# Patient Record
Sex: Female | Born: 2000 | Hispanic: Yes | Marital: Single | Smoking: Former smoker
Health system: Southern US, Community
[De-identification: ages and names within clinical notes are randomized; demographics above are authoritative.]

## PROBLEM LIST (undated history)

## (undated) DIAGNOSIS — F913 Oppositional defiant disorder: Secondary | ICD-10-CM

## (undated) DIAGNOSIS — F410 Panic disorder [episodic paroxysmal anxiety] without agoraphobia: Secondary | ICD-10-CM

## (undated) DIAGNOSIS — R4689 Other symptoms and signs involving appearance and behavior: Secondary | ICD-10-CM

## (undated) DIAGNOSIS — F4010 Social phobia, unspecified: Secondary | ICD-10-CM

## (undated) DIAGNOSIS — F419 Anxiety disorder, unspecified: Secondary | ICD-10-CM

## (undated) HISTORY — PX: MOUTH SURGERY: SHX715

---
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
## 2019-04-19 ENCOUNTER — Ambulatory Visit: Payer: Medicaid Other

## 2020-03-18 IMAGING — CT CT ABD-PELV W/ CM
2 of 4 series · 16 of 46 positions shown, 18 images · IV contrast (ISOVUE)
Comparison: None.

CLINICAL DATA: Abdominal pain.  Evaluate for appendicitis.

EXAM:
CT ABDOMEN AND PELVIS WITH CONTRAST
TECHNIQUE: Multidetector CT imaging of the abdomen and pelvis was performed
using the standard protocol following bolus administration of
intravenous contrast.
CONTRAST:  100mL MJWOE0-LII IOPAMIDOL (MJWOE0-LII) INJECTION 61%

[Series 2: axial st · axial · 0.68mm/px · z∈[-545,-165]mm · 13 of 86 slices shown, 15 images]
[im 5/86  soft-tissue]
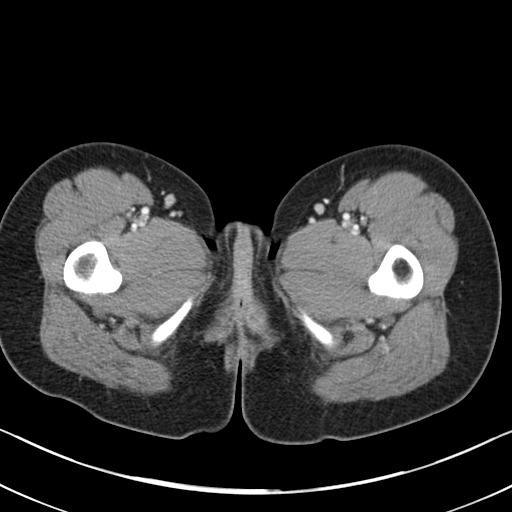
[im 5/86  bone]
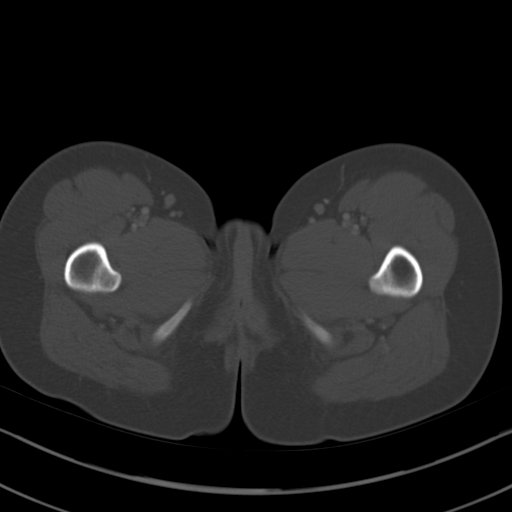
[im 13/86  soft-tissue]
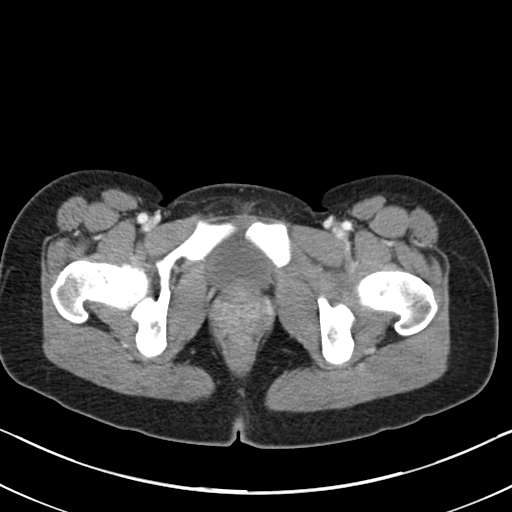
[im 18/86  soft-tissue]
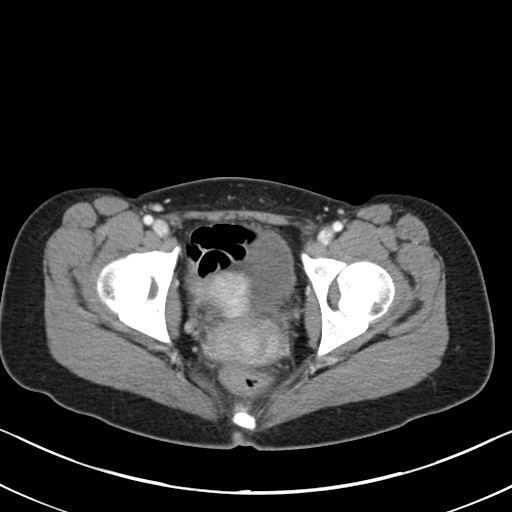
[im 26/86  soft-tissue]
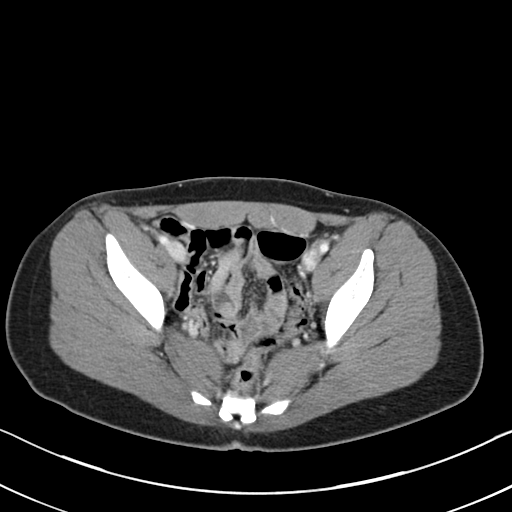
[im 30/86  soft-tissue]
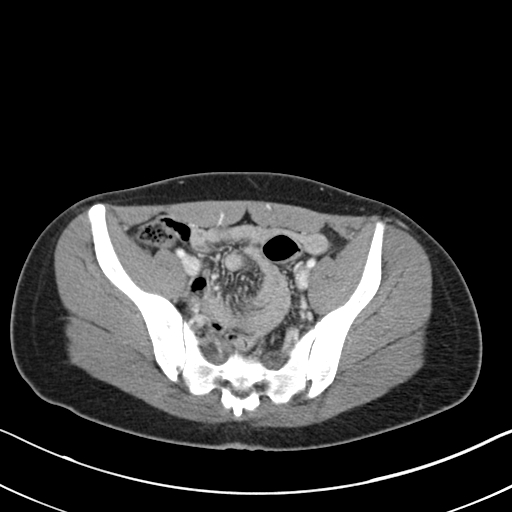
[im 39/86  soft-tissue]
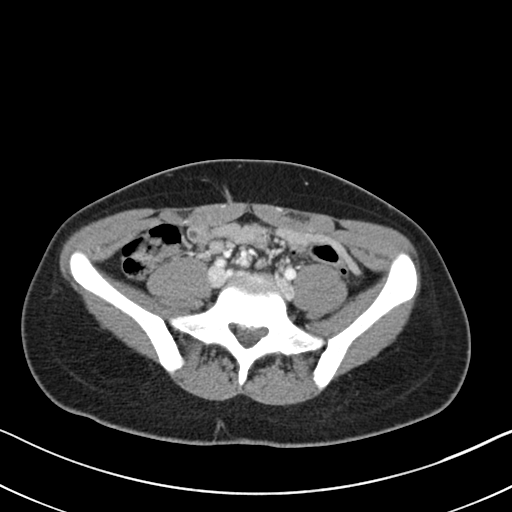
[im 43/86  soft-tissue]
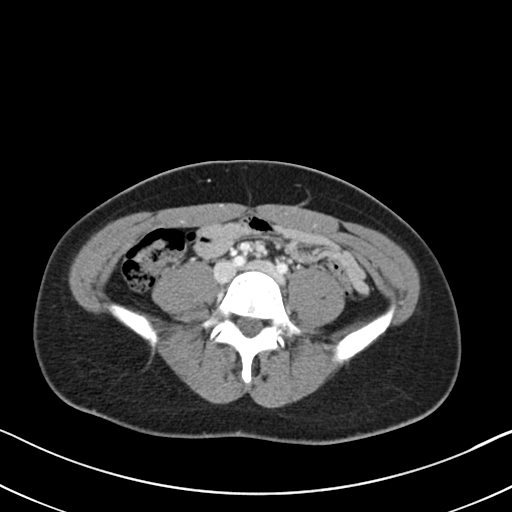
[im 47/86  soft-tissue]
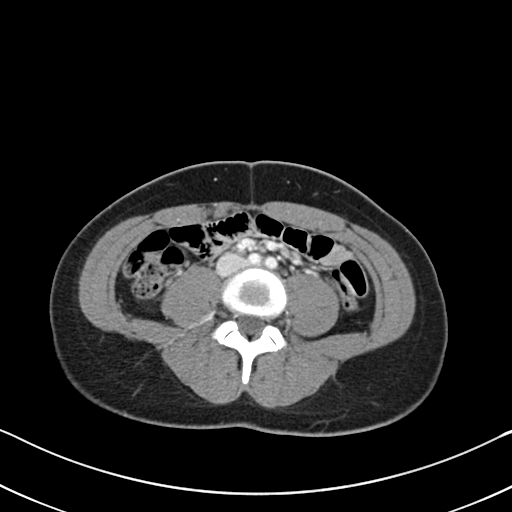
[im 56/86  soft-tissue]
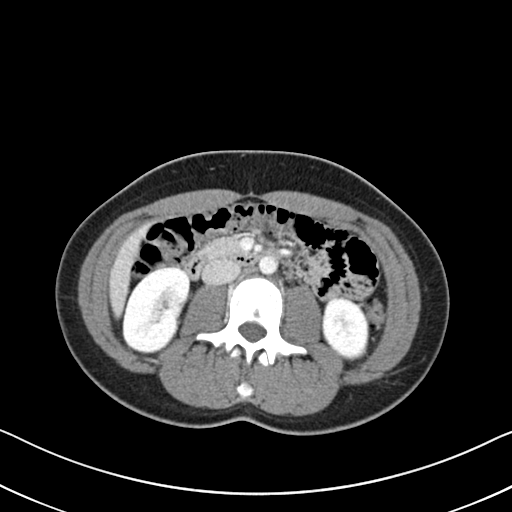
[im 56/86  bone]
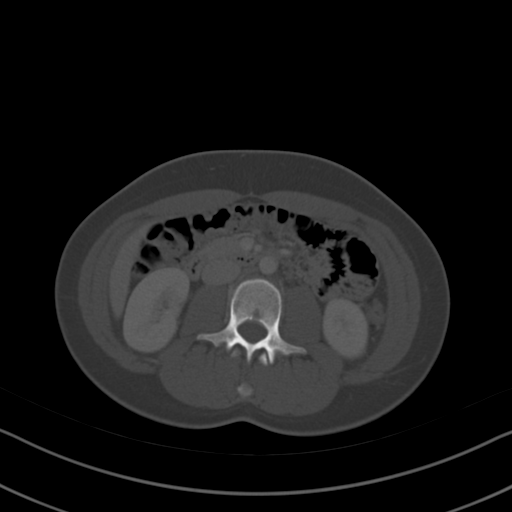
[im 60/86  soft-tissue]
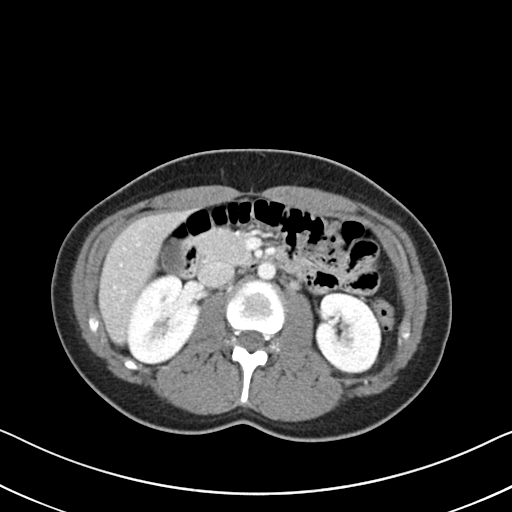
[im 69/86  soft-tissue]
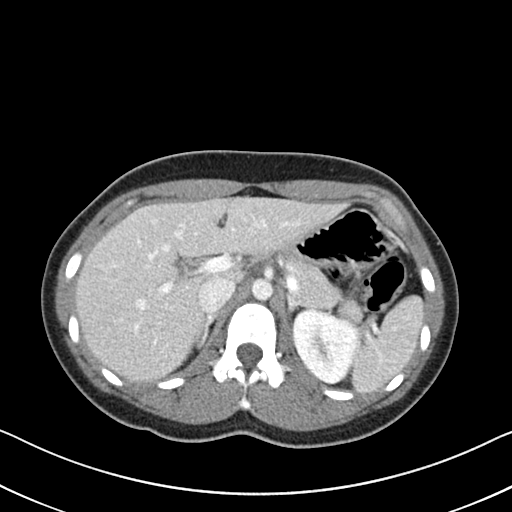
[im 73/86  soft-tissue]
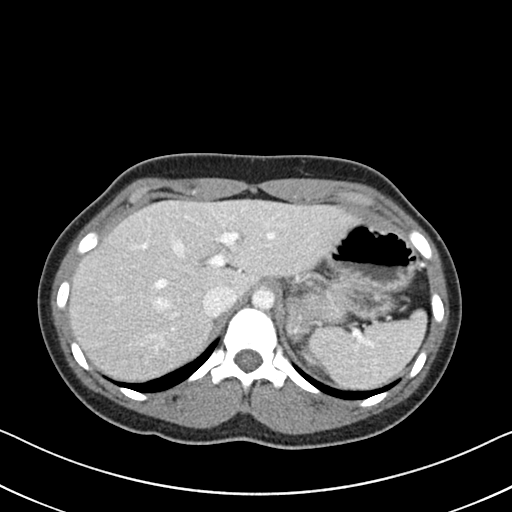
[im 81/86  soft-tissue]
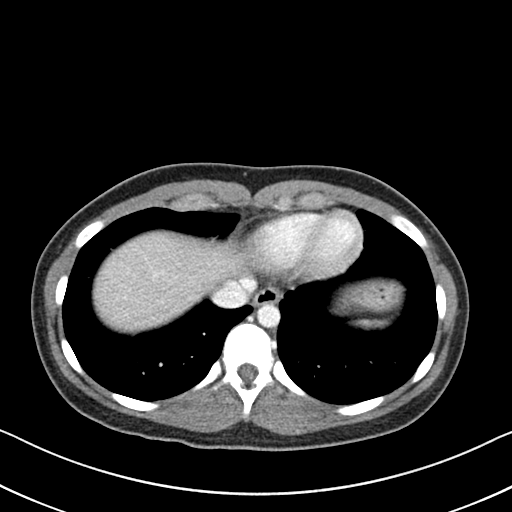

[Series 5: coronal st · coronal · 0.68mm/px · 3 of 71 slices shown]
[im 24/71  soft-tissue]
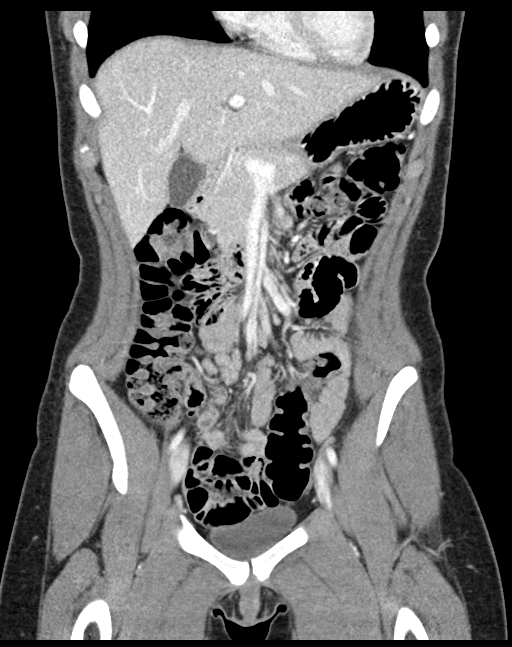
[im 32/71  soft-tissue]
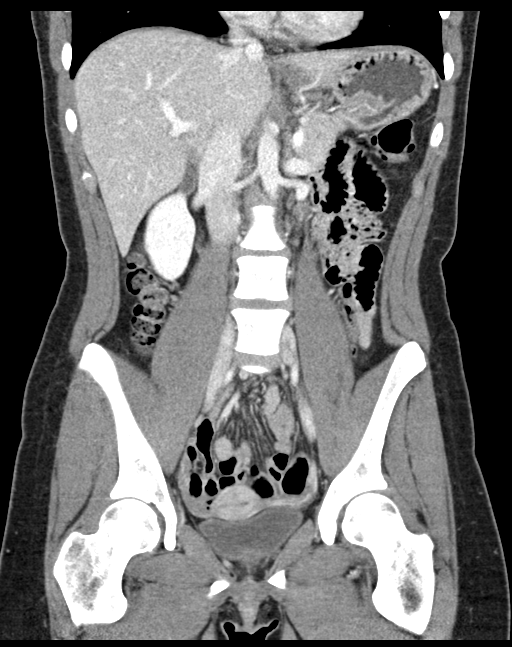
[im 39/71  soft-tissue]
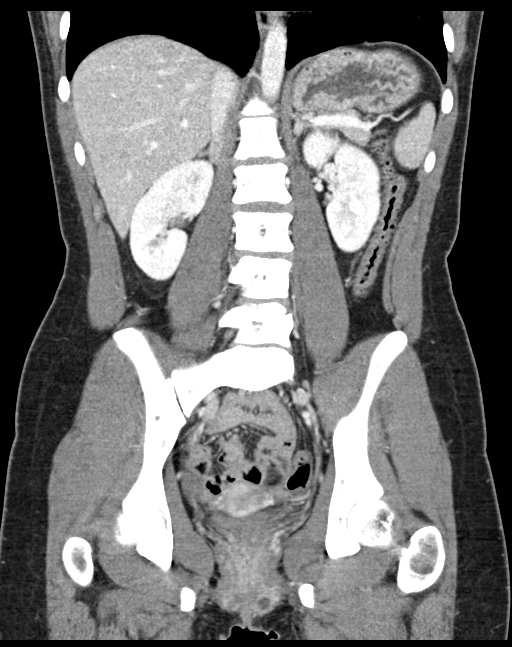

[16 of 46 positions shown; findings below may reference images not displayed]

FINDINGS: Lower chest: Normal heart size. Dependent atelectasis. No pleural
effusion.

Hepatobiliary: The liver is normal in size and contour. No focal
hepatic lesions identified. Gallbladder is unremarkable. No
intrahepatic or extrahepatic biliary ductal dilatation.

Pancreas: Unremarkable

Spleen: Unremarkable

Adrenals/Urinary Tract: Normal adrenal glands. Kidneys enhance
symmetrically with contrast. No hydronephrosis. Urinary bladder is
unremarkable.

Stomach/Bowel: No abnormal bowel wall thickening or evidence for
bowel obstruction. Normal appendix. Normal morphology of the
stomach.

Vascular/Lymphatic: Normal caliber abdominal aorta. No
retroperitoneal lymphadenopathy.

Reproductive: Uterus and adnexal structures are unremarkable. There
is a 1.4 cm left Bartholin's gland cyst.

Other: None.

Musculoskeletal: No acute or significant osseous findings.
IMPRESSION: No acute process within the abdomen or pelvis.

Normal appendix.

## 2020-03-20 IMAGING — CR DG CHEST 2V
2 series · 2 of 2 positions shown · non-contrast
Comparison: None.

CLINICAL DATA: Cough

EXAM:
CHEST - 2 VIEW

[w chest pa]
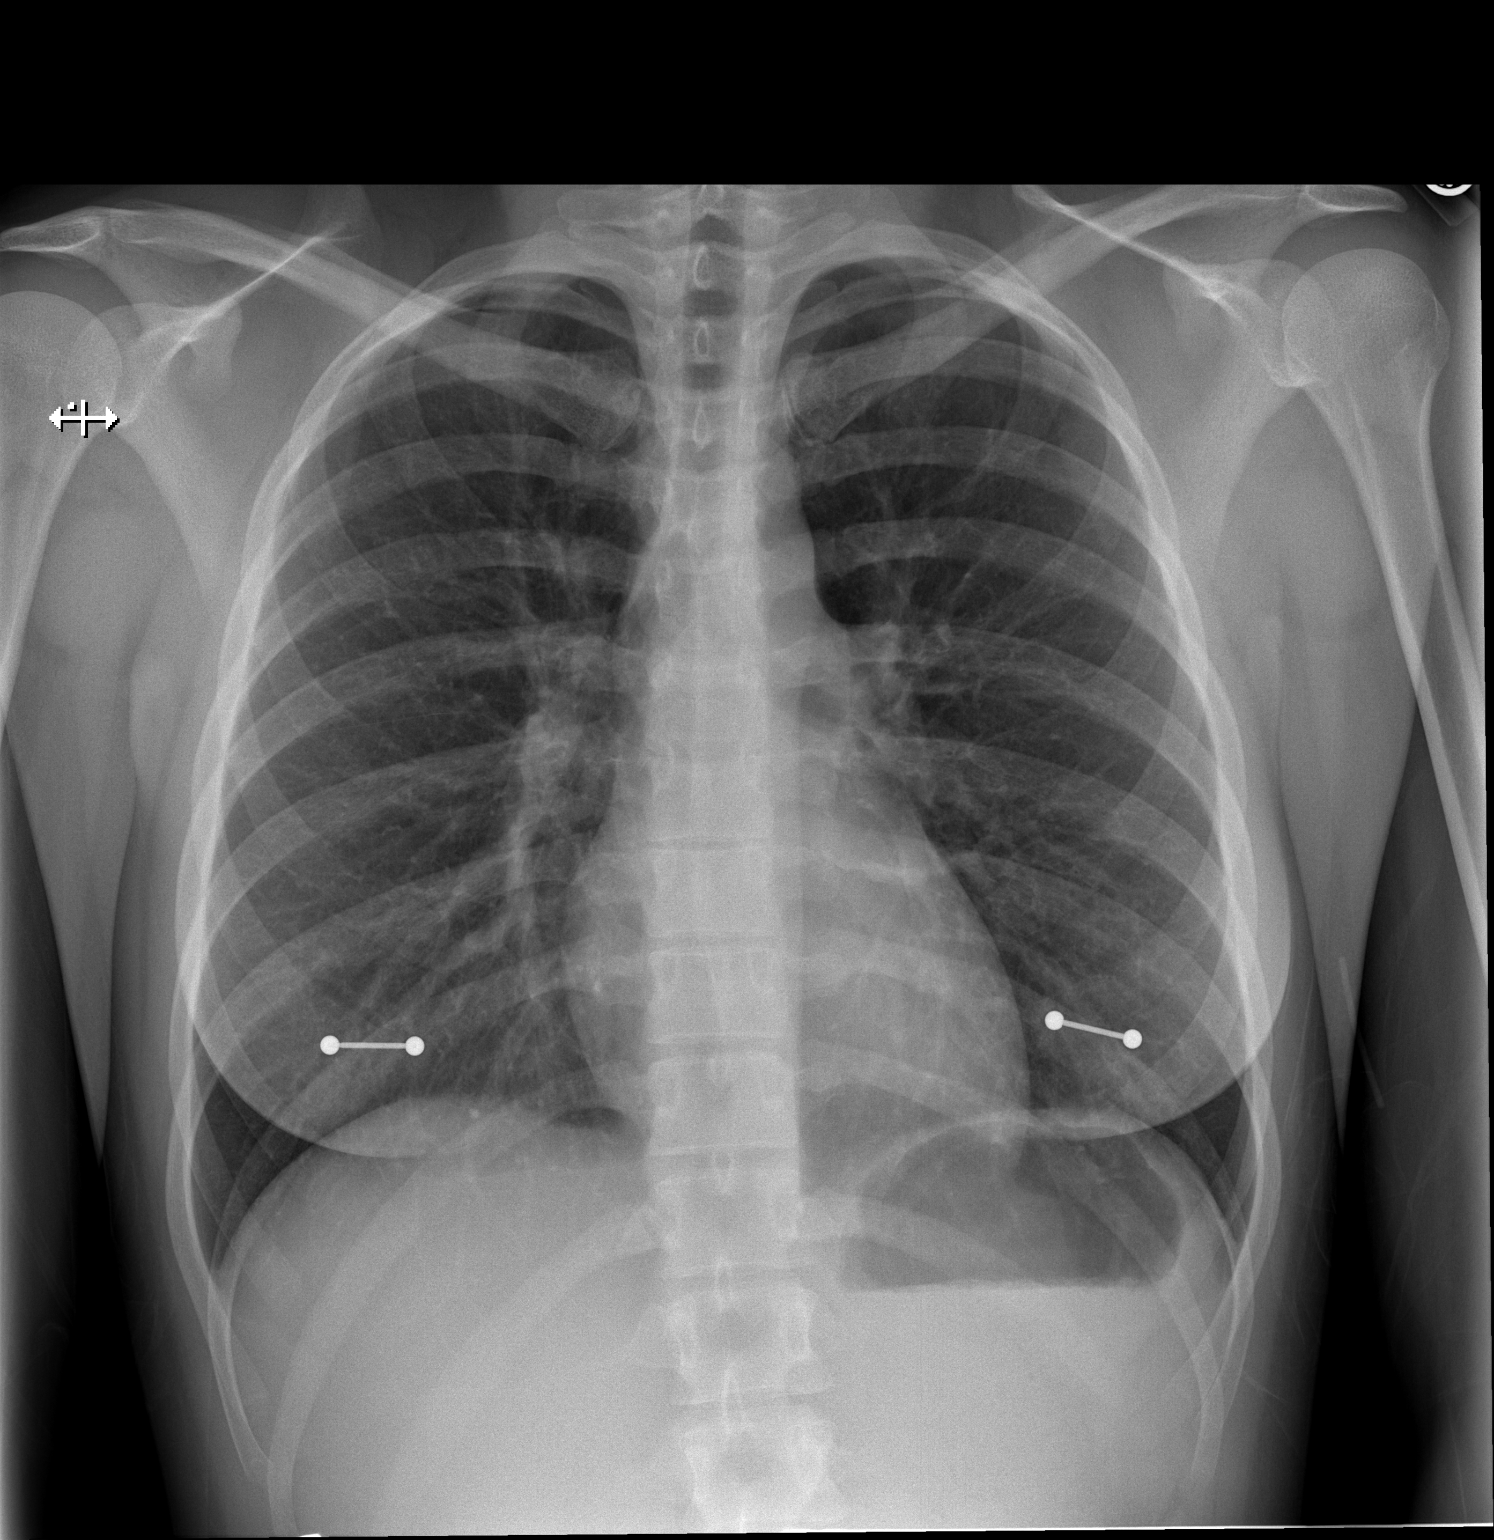

[w chest lat]
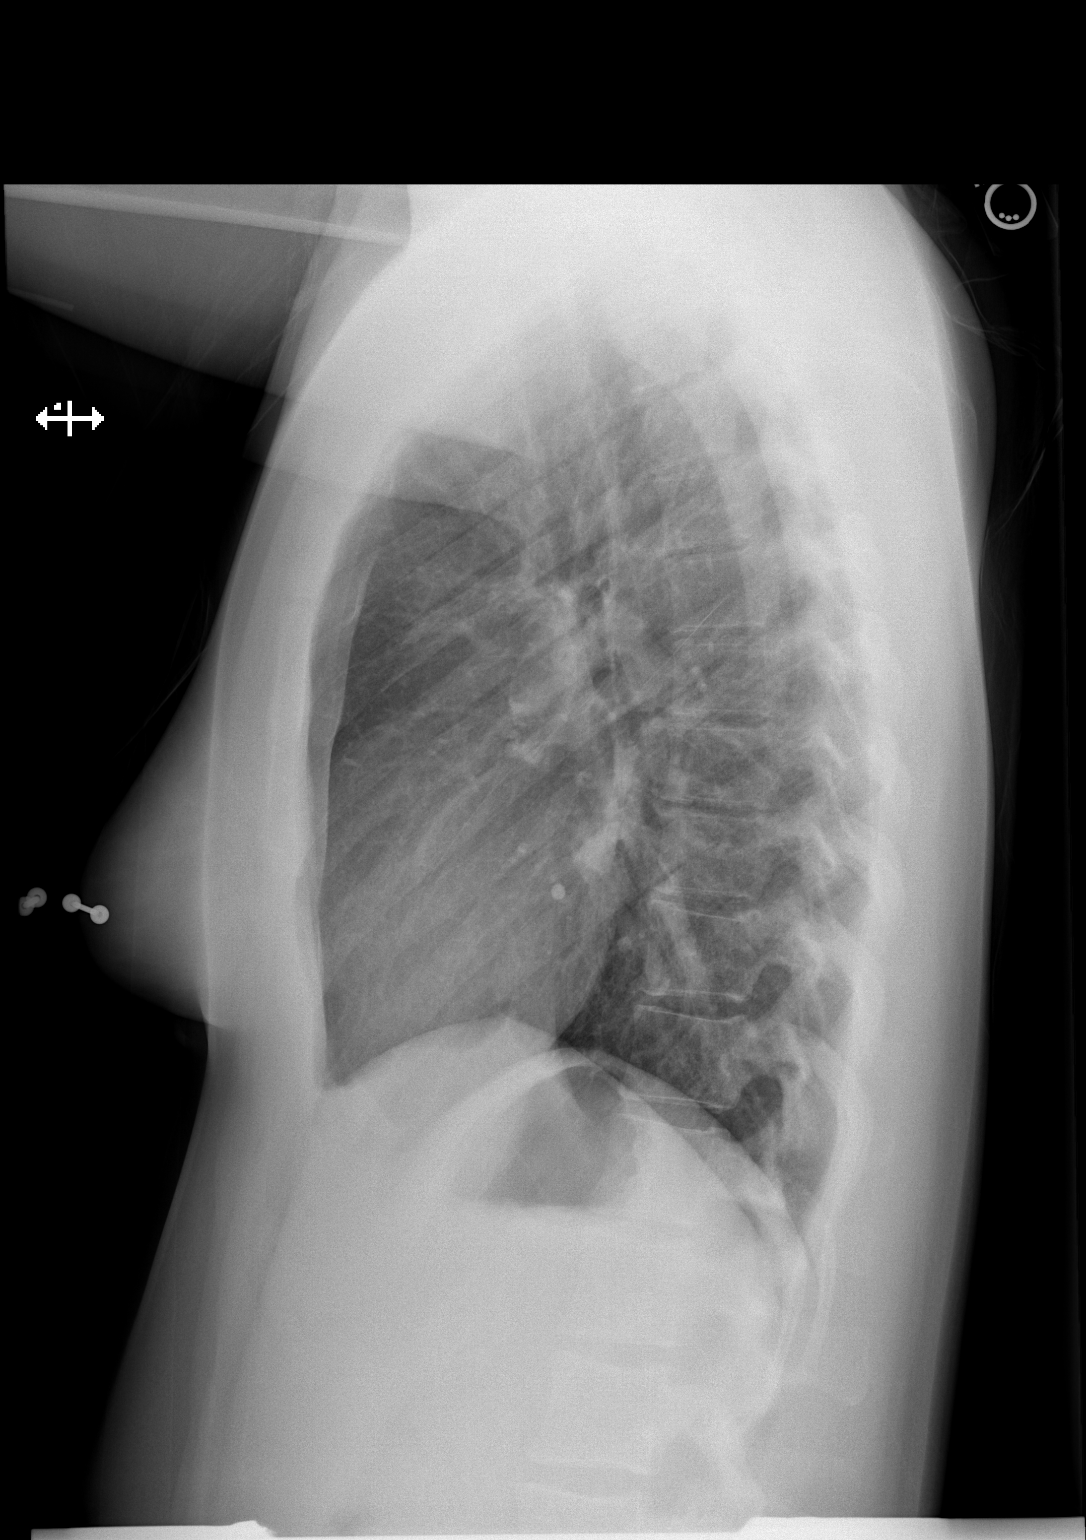

[2 of 2 positions shown; findings below may reference images not displayed]

FINDINGS: Lungs are clear. Heart size and pulmonary vascularity are normal. No
adenopathy. No bone lesions.
IMPRESSION: No edema or consolidation.

## 2020-04-21 ENCOUNTER — Other Ambulatory Visit: Payer: Self-pay

## 2020-04-21 ENCOUNTER — Emergency Department (HOSPITAL_COMMUNITY)
Admission: EM | Admit: 2020-04-21 | Discharge: 2020-04-21 | Disposition: A | Discharge status: Home / Self Care | Payer: Medicaid Other | Attending: Emergency Medicine | Admitting: Emergency Medicine

## 2020-04-21 DIAGNOSIS — Z20822 Contact with and (suspected) exposure to covid-19: Secondary | ICD-10-CM | POA: Insufficient documentation

## 2020-04-21 DIAGNOSIS — M549 Dorsalgia, unspecified: Secondary | ICD-10-CM | POA: Diagnosis present

## 2020-04-21 DIAGNOSIS — Z87891 Personal history of nicotine dependence: Secondary | ICD-10-CM | POA: Diagnosis not present

## 2020-04-21 DIAGNOSIS — F1210 Cannabis abuse, uncomplicated: Secondary | ICD-10-CM | POA: Diagnosis not present

## 2020-04-21 DIAGNOSIS — J069 Acute upper respiratory infection, unspecified: Secondary | ICD-10-CM | POA: Insufficient documentation

## 2020-04-21 DIAGNOSIS — M545 Low back pain: Secondary | ICD-10-CM | POA: Insufficient documentation

## 2020-04-21 DIAGNOSIS — R110 Nausea: Secondary | ICD-10-CM | POA: Diagnosis not present

## 2020-04-21 LAB — URINALYSIS, ROUTINE W REFLEX MICROSCOPIC
Bilirubin Urine: NEGATIVE
Glucose, UA: NEGATIVE mg/dL
Hgb urine dipstick: NEGATIVE
Ketones, ur: NEGATIVE mg/dL
Nitrite: NEGATIVE
Protein, ur: NEGATIVE mg/dL
Specific Gravity, Urine: 1.026 (ref 1.005–1.030)
pH: 5 (ref 5.0–8.0)

## 2020-04-21 LAB — POC URINE PREG, ED: Preg Test, Ur: NEGATIVE

## 2020-04-21 LAB — GROUP A STREP BY PCR: Group A Strep by PCR: NOT DETECTED

## 2020-04-21 LAB — SARS CORONAVIRUS 2 BY RT PCR (HOSPITAL ORDER, PERFORMED IN ~~LOC~~ HOSPITAL LAB): SARS Coronavirus 2: NEGATIVE

## 2020-04-21 MED ORDER — CYCLOBENZAPRINE HCL 10 MG PO TABS: 10.0000 mg | ORAL_TABLET | Freq: Three times a day (TID) | ORAL | 0 refills | Status: AC | PRN

## 2020-04-21 NOTE — ED Triage Notes (Signed)
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 Patient also endorses URI symptoms including throat pain, nasal drainage and congestion.

## 2020-04-21 NOTE — ED Provider Notes (Signed)
[facility]-EMERGENCY DEPT Provider Note   CSN: [id number] Arrival date & time: 04/21/20  7035     History No chief complaint on file.   Michelle Haney is a 19 y.o. female.  Who presents to the ED today with back pain for the past 4 days as well as productive cough, sore throat, runny nose, chills, and nausea for the past 2 days. 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 She denies She does mention that she normally stretches before working, however, she did not prior to the pain onsetting. The pain is sharp in nature, constant, and worse with movement. She denies any painful urination.   The patient also states she has had a productive cough, with some streaking blood in the phlegm. She denies chest pain, shortness of breath, or other back pain than the described above. She also notes a sore throat that is painful when she swallows. Denies abdominal pain, vomiting diarrhea, leg swelling. She also mentions she has a history of being diagnosed with strep throat, but the sore throat she is having currently is different in that it is less severe than her prior episodes.    She denies being COVID vaccinated.  She has no other complaints at this time.   The history is provided by the patient.  Back Pain Location:  Thoracic spine Quality: sharp. Radiates to:  Does not radiate Pain severity:  Mild Onset quality:  Sudden Timing:  Constant Progression:  Unchanged Context: occupational injury   Associated symptoms: no abdominal pain, no chest pain, no dysuria and no fever        Past Medical History:  Diagnosis Date  . Anxiety   . Oppositional defiant behavior 05/24/2017  . Panic attack 05/24/2017  . Panic attacks   . Social anxiety disorder 05/24/2017  . Suicidal behavior 05/24/2017    Patient Active Problem List   Diagnosis Date Noted  . Abnormal EEG 10/06/2017  . Seizure (HCC) 10/06/2017  . Social anxiety  disorder 05/24/2017  . Panic attack 05/24/2017  . Oppositional defiant behavior 05/24/2017  . Overdose of antidepressant, intentional self-harm, subsequent encounter 05/24/2017  . Encounter for initial prescription of Nexplanon 08/10/2016    Past Surgical History:  Procedure Laterality Date  . MOUTH SURGERY       OB History    Gravida  0   Para  0   Term  0   Preterm  0   AB  0   Living  0     SAB  0   TAB  0   Ectopic  0   Multiple  0   Live Births  0           Family History  Problem Relation Age of Onset  . Hypothyroidism Mother   . Migraines Mother   . Depression Mother   . Anxiety disorder Mother   . Hypertension Father   . Hypertension Maternal Grandfather   . Seizures Cousin   . Bipolar disorder Neg Hx   . Schizophrenia Neg Hx   . ADD / ADHD Neg Hx   . Autism Neg Hx     Social History   Tobacco Use  . Smoking status: Former Smoker    Packs/day: 1.00    Types: Cigarettes  . Smokeless tobacco: Never Used  Vaping Use  . Vaping Use: Former  Substance Use Topics  . Alcohol use: Yes    Comment: occasional use  .  Drug use: Yes    Types: Marijuana    Comment: once a week    Home Medications Prior to Admission medications   Medication Sig Start Date End Date Taking? Authorizing Provider  cetirizine (ZYRTEC) 10 MG tablet Take 10 mg by mouth daily as needed for allergies.     [provider]  ciprofloxacin (CIPRO) 500 MG tablet Take 500 mg by mouth 2 (two) times daily. 10/08/18   [provider]  cyclobenzaprine (FLEXERIL) 10 MG tablet Take 1 tablet (10 mg total) by mouth 3 (three) times daily as needed for up to 5 days for muscle spasms. 04/21/20 04/26/20  Belva Agee, MD  ondansetron (ZOFRAN ODT) 4 MG disintegrating tablet Take 1 tablet (4 mg total) by mouth every 8 (eight) hours as needed for nausea or vomiting. 10/13/18   Maxwell Caul, PA-C  oseltamivir (TAMIFLU) 75 MG capsule Take 1 capsule (75 mg total) by  mouth every 12 (twelve) hours. 10/13/18   Maxwell Caul, PA-C    Allergies    Pollen extract  Review of Systems   Review of Systems  Constitutional: Positive for chills. Negative for fever.  HENT: Positive for congestion, rhinorrhea and sore throat.   Respiratory: Negative for shortness of breath.   Cardiovascular: Negative for chest pain.  Gastrointestinal: Positive for nausea. Negative for abdominal pain, constipation, diarrhea and vomiting.  Genitourinary: Negative for dysuria.  Musculoskeletal: Positive for back pain. Negative for neck pain.  All other systems reviewed and are negative.   Physical Exam Updated Vital Signs BP 123/81 (BP Location: Left Arm)   Pulse 83   Temp 99 F (37.2 C)   Resp 16   LMP 03/25/2020 (Approximate)   SpO2 100%   Physical Exam Vitals and nursing note reviewed.  Constitutional:      General: She is not in acute distress.    Appearance: Normal appearance. She is not ill-appearing, toxic-appearing or diaphoretic.  HENT:     Head: Normocephalic and atraumatic.     Nose: Nose normal. No congestion or rhinorrhea.     Mouth/Throat:     Mouth: Mucous membranes are moist.     Pharynx: No oropharyngeal exudate or posterior oropharyngeal erythema.  Eyes:     Pupils: Pupils are equal, round, and reactive to light.  Cardiovascular:     Rate and Rhythm: Normal rate and regular rhythm.     Pulses: Normal pulses.     Heart sounds: Normal heart sounds. No murmur heard.   Pulmonary:     Effort: Pulmonary effort is normal. No respiratory distress.     Breath sounds: Normal breath sounds. No stridor. No wheezing, rhonchi or rales.  Abdominal:     General: Abdomen is flat. There is no distension.     Palpations: Abdomen is soft.     Tenderness: There is no abdominal tenderness. There is no guarding or rebound.  Musculoskeletal:     Cervical back: Normal range of motion and neck supple.     Right lower leg: No edema.     Left lower leg: No edema.    Lymphadenopathy:     Cervical: Cervical adenopathy present.  Skin:    General: Skin is warm and dry.  Neurological:     General: No focal deficit present.     Mental Status: She is alert.     ED Results / Procedures / Treatments   Labs (all labs ordered are listed, but only abnormal results are displayed) Labs Reviewed  URINALYSIS, ROUTINE  W REFLEX MICROSCOPIC - Abnormal; Notable for the following components:      Result Value   Leukocytes,Ua TRACE (*)    Bacteria, UA RARE (*)    All other components within normal limits  GROUP A STREP BY PCR  SARS CORONAVIRUS 2 BY RT PCR (HOSPITAL ORDER, PERFORMED IN [facility])  POC URINE PREG, ED    EKG None  Radiology No results found.  Procedures Procedures (including critical care time)  Medications Ordered in ED Medications - No data to display  ED Course  I have reviewed the triage vital signs and the nursing notes.  Pertinent labs & imaging results that were available during my care of the patient were reviewed by me and considered in my medical decision making (see chart for details).    MDM Rules/Calculators/A&P                          Asalee Barrette is a 19 y/o F who presents to the ED with right sided lower back pain, cough, congestion, and sore throat.  Low suspicion for pneumonia, do not believe chest x-ray or other imaging is warranted at this time. Patient's lungs were CTA on physical exam. Suspect patient's cough, rhinorrhea, congestion, and sore throat are viral URI in etiology. Strep swab negative.   Patient did ask about pain medication and it was discussed with her that we recommend over the counter pain medication for muscle pain as well as rest and heat. She denies needing a work note.   Recommend patient follow up with her primary care provider and return to closest ED if symptoms worsen or new symptoms arise. Discussed with patient the need to check her mychart for her covid test  results. If positive recommend patient quarantine for 2 weeks and return to ED if any complications arise.   Final Clinical Impression(s) / ED Diagnoses Final diagnoses:  Viral URI with cough  Musculoskeletal back pain    Rx / DC Orders ED Discharge Orders         Ordered    cyclobenzaprine (FLEXERIL) 10 MG tablet  3 times daily PRN     Discontinue  Reprint     04/21/20 1304           Belva Agee, MD 04/21/20 1325    Gwyneth Sprout, MD 04/22/20 [phone]

## 2020-04-21 NOTE — Discharge Instructions (Addendum)
Thank you for allowing [place] to care for you today! Please rest and use warm compresses as well as the flexeril and ibuprofen or tylenol for continued pain. Please take flexeril as prescribed.   Please follow up with your primary care provider. If you have any new symptoms or concerns please return to your closest ED. Please check your mychart for the results of your COVID test.
